# Patient Record
Sex: Female | Born: 1955 | Race: White | Hispanic: No | Marital: Married | Smoking: Never smoker
Health system: Southern US, Community
[De-identification: ages and names within clinical notes are randomized; demographics above are authoritative.]

---

## 2007-10-10 ENCOUNTER — Encounter: Admission: RE | Admit: 2007-10-10 | Discharge: 2007-10-10 | Payer: Self-pay | Admitting: Family Medicine

## 2007-12-03 ENCOUNTER — Encounter: Admission: RE | Admit: 2007-12-03 | Discharge: 2007-12-03 | Payer: Self-pay | Admitting: Family Medicine

## 2011-06-07 ENCOUNTER — Ambulatory Visit (INDEPENDENT_AMBULATORY_CARE_PROVIDER_SITE_OTHER): Payer: 59 | Admitting: Sports Medicine

## 2011-06-07 ENCOUNTER — Encounter: Payer: Self-pay | Admitting: Sports Medicine

## 2011-06-07 VITALS — BP 117/73 | HR 76 | Ht 64.5 in | Wt 126.0 lb

## 2011-06-07 DIAGNOSIS — S93409A Sprain of unspecified ligament of unspecified ankle, initial encounter: Secondary | ICD-10-CM | POA: Insufficient documentation

## 2011-06-07 NOTE — Assessment & Plan Note (Signed)
Ankle stabilizing orthosis for 2 weeks. Ankle rehabilitation exercises. Oral analgesics as needed. Patient can return to see me in 3 weeks as needed if no better.

## 2011-06-07 NOTE — Progress Notes (Signed)
  Subjective:    Patient ID: Barbara Mccarthy, female    DOB: June 13, 1956, 55 y.o.   MRN: [id number]  HPI The patient is a healthy 55 year old female who on June 29 accidentally inverted her ankle. Afterward she developed some bruising and swelling. The pain was most pronounced over the ATFL and CFL. Since then she has not been doing any rehabilitation exercises, pain medications, or wearing a brace. Symptoms are overall improving and she just wanted to make sure that there is nothing bad going on.  Medical, surgical, social, family history were reviewed and were negative.  Review of Systems    no fevers chills night sweats or weight loss or it Objective:   Physical Exam General: Well-developed well-nourished Caucasian female in no acute distress. Musculoskeletal exam: Right Ankle: No visible erythema and only minimal swelling on the lateral ankle. Range of motion is full in all directions. Strength is 5/5 in all directions. Stable lateral and medial ligaments; squeeze test and kleiger test unremarkable; Talar dome nontender; No pain at base of 5th MT; No tenderness over cuboid; No tenderness over N spot or navicular prominence No tenderness on posterior aspects of lateral and medial malleolus No sign of peroneal tendon subluxations or tenderness to palpation Negative tarsal tunnel tinel's Tender to palpation over ATFL and CFL. Negative anterior drawer test.  Musculoskeletal ultrasound: I imaged the ATFL and noted to be intact. There were no abnormalities noted either with dynamic imaging of the ATFL during anterior drawer maneuver. Images saved     Assessment & Plan:   No problem-specific assessment & plan notes found for this encounter.

## 2011-08-02 ENCOUNTER — Ambulatory Visit (INDEPENDENT_AMBULATORY_CARE_PROVIDER_SITE_OTHER): Payer: 59 | Admitting: Sports Medicine

## 2011-08-02 DIAGNOSIS — M7750 Other enthesopathy of unspecified foot: Secondary | ICD-10-CM

## 2011-08-02 DIAGNOSIS — S93409A Sprain of unspecified ligament of unspecified ankle, initial encounter: Secondary | ICD-10-CM

## 2011-08-02 DIAGNOSIS — M7671 Peroneal tendinitis, right leg: Secondary | ICD-10-CM | POA: Insufficient documentation

## 2011-08-02 MED ORDER — KETOPROFEN POWD: Status: DC

## 2011-08-02 NOTE — Progress Notes (Signed)
  Subjective:    Patient ID: Barbara Mccarthy, female    DOB: 04/22/1956, 55 y.o.   MRN: [id number]  HPI The patient is a healthy 55 year old female who on June 29 had an ankle inversion injury consistent with a grade 1 ATFL sprain. I placed her in an ASO brace, and have her do ankle rehabilitation exercises. I also ultrasound at her ATFL and noted that it was intact. The ankle was overall stable at that time. This completely resolved, however last week she developed some pain of the lateral ankle. There was no overt injury, she went hiking, and does occasionally wear the brace.    Review of Systems    negative with regards to the chief complaint Objective:   Physical Exam General: Well-developed well-nourished Caucasian female in no acute distress. Musculoskeletal exam: Right Ankle: No visible erythema and only minimal continued swelling on the lateral ankle. Range of motion is full in all directions. Strength is 5/5 in all directions. Stable lateral and medial ligaments; squeeze test and kleiger test unremarkable; Talar dome nontender; No pain at base of 5th MT; No tenderness over cuboid; No tenderness over N spot or navicular prominence No tenderness on posterior aspects of lateral and medial malleolus No sign of peroneal tendon subluxations or tenderness to palpation Negative tarsal tunnel tinel's Negative anterior drawer test.  Musculoskeletal ultrasound: I imaged the ATFL and noted to be intact. The peroneal tendons appeared to be edematous, and disorganized, consistent with tendinosis. He may have also been a small gap in the peroneus brevis tendon. No increased Doppler flow. Images saved     Assessment & Plan:

## 2011-08-02 NOTE — Assessment & Plan Note (Signed)
Air sport brace for 4 weeks. Topical ketoprofen. Come back to see [place] in 4 weeks.

## 2011-08-02 NOTE — Patient Instructions (Signed)
Looks like to have some small irregularities in your peroneal tendons. We're going to put you in a special air sport brace. Come back to see [place] in 4-6 weeks. I would like you to use topical ketoprofen and applied to the affected area 3 times a day. Ihor Austin. Benjamin Stain, M.D.

## 2011-08-02 NOTE — Assessment & Plan Note (Signed)
Resolved

## 2011-08-30 ENCOUNTER — Ambulatory Visit: Payer: 59 | Admitting: Sports Medicine

## 2011-09-15 ENCOUNTER — Ambulatory Visit (INDEPENDENT_AMBULATORY_CARE_PROVIDER_SITE_OTHER): Payer: 59 | Admitting: Sports Medicine

## 2011-09-15 VITALS — BP 110/60

## 2011-09-15 DIAGNOSIS — S93409A Sprain of unspecified ligament of unspecified ankle, initial encounter: Secondary | ICD-10-CM

## 2011-09-15 NOTE — Progress Notes (Signed)
  Subjective:    Patient ID: Barbara Mccarthy, female    DOB: 1955/11/17, 55 y.o.   MRN: [id number]  HPI  Ms. Boye is coming today to F/ U on right ankle sprain. 95% better. No pain no swelling in her ankle. She still wearing the brace.She has not done the HEP for ankle rehab. She is walking without a limp. No numbness or tingling distally.  Patient Active Problem List  Diagnoses  . Grade 1 ankle sprain  . Peroneal tendinitis of right lower extremity   Current Outpatient Prescriptions on File Prior to Visit  Medication Sig Dispense Refill  . Ketoprofen POWD Compound into a 20% gel and apply to affected area on ankle 3 times per day.  25 g  3   No Known Allergies     Review of Systems  Constitutional: Negative for fever, chills, diaphoresis and fatigue.  Musculoskeletal: Negative for myalgias, back pain, joint swelling and gait problem.  Neurological: Negative for weakness and numbness.       Objective:   Physical Exam  Constitutional: She is oriented to person, place, and time. She appears well-developed and well-nourished.       BP 110/60   Pulmonary/Chest: Effort normal.  Musculoskeletal:       R ankle with intact skin, no swelling , no inflammation. FROM.  No TTP in anterior joint line. Anterior drawer test negative. Negative tilt test. Gait independent w/o limp. Sensation intact distally.     Neurological: She is alert and oriented to person, place, and time.  Skin: Skin is warm. No rash noted. No erythema. No pallor.  Psychiatric: She has a normal mood and affect. Her behavior is normal.          Assessment & Plan:   1. Grade 1 ankle sprain    Ankle HEP Use the brace with risky activities F/U in 2 month.

## 2011-09-15 NOTE — Patient Instructions (Signed)
Ankle HEP Use the brace with risky activities F/U in 2 month.

## 2016-07-06 ENCOUNTER — Encounter: Payer: Self-pay | Admitting: Student

## 2016-07-06 ENCOUNTER — Ambulatory Visit (INDEPENDENT_AMBULATORY_CARE_PROVIDER_SITE_OTHER): Payer: 59 | Admitting: Student

## 2016-07-06 VITALS — BP 136/69 | HR 85 | Ht 64.0 in | Wt 130.0 lb

## 2016-07-06 DIAGNOSIS — R229 Localized swelling, mass and lump, unspecified: Secondary | ICD-10-CM | POA: Diagnosis not present
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
## 2016-07-06 NOTE — Assessment & Plan Note (Signed)
She is soft tissue swelling in the soft tissues superficial to the anterior tibia. She is given reassurance. She can follow-up as needed. I offered compression sleeve but patient did not wish to use this. She may take over-the-counter anti-inflammatories as needed. She stated she did not need this.

## 2021-12-17 ENCOUNTER — Encounter (HOSPITAL_COMMUNITY): Payer: Self-pay | Admitting: Emergency Medicine

## 2021-12-17 ENCOUNTER — Emergency Department (HOSPITAL_COMMUNITY): Payer: No Typology Code available for payment source

## 2021-12-17 ENCOUNTER — Emergency Department (HOSPITAL_COMMUNITY)
Admission: EM | Admit: 2021-12-17 | Discharge: 2021-12-18 | Disposition: A | Discharge status: Home / Self Care | Payer: No Typology Code available for payment source | Attending: Emergency Medicine | Admitting: Emergency Medicine

## 2021-12-17 ENCOUNTER — Other Ambulatory Visit: Payer: Self-pay

## 2021-12-17 DIAGNOSIS — S62352A Nondisplaced fracture of shaft of third metacarpal bone, right hand, initial encounter for closed fracture: Secondary | ICD-10-CM

## 2021-12-17 DIAGNOSIS — M79642 Pain in left hand: Secondary | ICD-10-CM | POA: Insufficient documentation

## 2021-12-17 DIAGNOSIS — Y92410 Unspecified street and highway as the place of occurrence of the external cause: Secondary | ICD-10-CM | POA: Diagnosis not present

## 2021-12-17 DIAGNOSIS — S62324A Displaced fracture of shaft of fourth metacarpal bone, right hand, initial encounter for closed fracture: Secondary | ICD-10-CM

## 2021-12-17 DIAGNOSIS — S62360A Nondisplaced fracture of neck of second metacarpal bone, right hand, initial encounter for closed fracture: Secondary | ICD-10-CM

## 2021-12-17 DIAGNOSIS — S62325A Displaced fracture of shaft of fourth metacarpal bone, left hand, initial encounter for closed fracture: Secondary | ICD-10-CM | POA: Insufficient documentation

## 2021-12-17 DIAGNOSIS — S62361A Nondisplaced fracture of neck of second metacarpal bone, left hand, initial encounter for closed fracture: Secondary | ICD-10-CM | POA: Insufficient documentation

## 2021-12-17 DIAGNOSIS — S6992XA Unspecified injury of left wrist, hand and finger(s), initial encounter: Secondary | ICD-10-CM | POA: Diagnosis present

## 2021-12-17 DIAGNOSIS — S62353A Nondisplaced fracture of shaft of third metacarpal bone, left hand, initial encounter for closed fracture: Secondary | ICD-10-CM | POA: Diagnosis not present

## 2021-12-17 NOTE — ED Notes (Signed)
[facility] Ortho tech consulted

## 2021-12-17 NOTE — ED Notes (Signed)
ED Provider at bedside. 
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
## 2021-12-17 NOTE — Discharge Instructions (Addendum)
Elevate your left hand above your heart is much as possible.  Use ice on the sore area for 1 hour, 4 times a day.  Take Tylenol and Motrin for pain.  Call the orthopedic doctor for follow-up appointment.

## 2021-12-18 NOTE — Progress Notes (Signed)
Orthopedic Tech Progress Note Patient Details:  Barbara Mccarthy 1956-07-05 [id number]  Ortho Devices Type of Ortho Device: Volar splint Ortho Device/Splint Interventions: Ordered, Application, Adjustment   Post Interventions Patient Tolerated: Well Instructions Provided: Adjustment of device, Care of device Consulted with Dr. Effie Shy on type of splint and extended the volar past the palmar crease for extra support due to the nature of the fracture.  Darleen Crocker 12/18/2021, 12:20 AM
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
